# Patient Record
Sex: Male | Born: 1990 | Race: Black or African American | Hispanic: No | Marital: Single | Smoking: Never smoker
Health system: Southern US, Community
[De-identification: ages and names within clinical notes are randomized; demographics above are authoritative.]

## PROBLEM LIST (undated history)

## (undated) DIAGNOSIS — W3400XA Accidental discharge from unspecified firearms or gun, initial encounter: Secondary | ICD-10-CM

## (undated) DIAGNOSIS — R569 Unspecified convulsions: Secondary | ICD-10-CM

## (undated) DIAGNOSIS — J45909 Unspecified asthma, uncomplicated: Secondary | ICD-10-CM

## (undated) DIAGNOSIS — F909 Attention-deficit hyperactivity disorder, unspecified type: Secondary | ICD-10-CM

## (undated) HISTORY — PX: SHOULDER SURGERY: SHX246

---

## 2010-07-01 ENCOUNTER — Emergency Department (HOSPITAL_COMMUNITY): Admission: EM | Admit: 2010-07-01 | Discharge: 2010-07-01 | Payer: Self-pay | Admitting: Emergency Medicine

## 2010-08-16 ENCOUNTER — Ambulatory Visit (HOSPITAL_COMMUNITY): Admission: RE | Admit: 2010-08-16 | Discharge: 2010-08-16 | Payer: Self-pay | Admitting: Orthopedic Surgery

## 2010-12-25 ENCOUNTER — Encounter: Payer: Self-pay | Admitting: Orthopedic Surgery

## 2011-02-04 ENCOUNTER — Emergency Department (HOSPITAL_COMMUNITY)
Admission: EM | Admit: 2011-02-04 | Discharge: 2011-02-04 | Disposition: A | Discharge status: Home / Self Care | Payer: Medicaid Other | Attending: Emergency Medicine | Admitting: Emergency Medicine

## 2011-02-04 DIAGNOSIS — M25519 Pain in unspecified shoulder: Secondary | ICD-10-CM | POA: Insufficient documentation

## 2011-03-20 ENCOUNTER — Emergency Department (HOSPITAL_COMMUNITY)
Admission: EM | Admit: 2011-03-20 | Discharge: 2011-03-21 | Disposition: A | Discharge status: Home / Self Care | Payer: Medicaid Other | Source: Home / Self Care | Attending: Emergency Medicine | Admitting: Emergency Medicine

## 2011-03-20 DIAGNOSIS — Z9889 Other specified postprocedural states: Secondary | ICD-10-CM | POA: Insufficient documentation

## 2011-03-20 DIAGNOSIS — M25519 Pain in unspecified shoulder: Secondary | ICD-10-CM | POA: Insufficient documentation

## 2011-03-21 ENCOUNTER — Emergency Department (HOSPITAL_COMMUNITY)
Admission: EM | Admit: 2011-03-21 | Discharge: 2011-03-21 | Disposition: A | Discharge status: Home / Self Care | Payer: Medicaid Other | Attending: Emergency Medicine | Admitting: Emergency Medicine

## 2011-07-10 ENCOUNTER — Emergency Department (HOSPITAL_COMMUNITY)
Admission: EM | Admit: 2011-07-10 | Discharge: 2011-07-10 | Disposition: A | Discharge status: Home / Self Care | Payer: Medicaid Other | Attending: Emergency Medicine | Admitting: Emergency Medicine

## 2011-07-10 DIAGNOSIS — F3289 Other specified depressive episodes: Secondary | ICD-10-CM | POA: Insufficient documentation

## 2011-07-10 DIAGNOSIS — F329 Major depressive disorder, single episode, unspecified: Secondary | ICD-10-CM | POA: Insufficient documentation

## 2011-07-10 LAB — DIFFERENTIAL
Basophils Absolute: 0 10*3/uL (ref 0.0–0.1)
Eosinophils Absolute: 0.1 10*3/uL (ref 0.0–0.7)
Eosinophils Relative: 1 % (ref 0–5)
Lymphs Abs: 2.1 10*3/uL (ref 0.7–4.0)
Monocytes Absolute: 0.3 10*3/uL (ref 0.1–1.0)
Neutro Abs: 4.3 10*3/uL (ref 1.7–7.7)
Neutrophils Relative %: 63 % (ref 43–77)

## 2011-07-10 LAB — COMPREHENSIVE METABOLIC PANEL
ALT: 14 U/L (ref 0–53)
AST: 23 U/L (ref 0–37)
BUN: 13 mg/dL (ref 6–23)
Calcium: 9.9 mg/dL (ref 8.4–10.5)
Creatinine, Ser: 0.92 mg/dL (ref 0.50–1.35)
GFR calc Af Amer: >60 mL/min (ref >60)
Total Bilirubin: 0.4 mg/dL (ref 0.3–1.2)

## 2011-07-10 LAB — RAPID URINE DRUG SCREEN, HOSP PERFORMED
Barbiturates: NOT DETECTED
Benzodiazepines: NOT DETECTED
Opiates: NOT DETECTED

## 2011-07-10 LAB — URINALYSIS, ROUTINE W REFLEX MICROSCOPIC
Glucose, UA: NEGATIVE mg/dL
Hgb urine dipstick: NEGATIVE
Leukocytes, UA: NEGATIVE
Urobilinogen, UA: 1 mg/dL (ref 0.0–1.0)

## 2011-07-10 LAB — CBC
MCHC: 35.2 g/dL (ref 30.0–36.0)
MCV: 84.4 fL (ref 78.0–100.0)
Platelets: 166 10*3/uL (ref 150–400)
WBC: 6.7 10*3/uL (ref 4.0–10.5)

## 2011-07-10 LAB — ETHANOL: Alcohol, Ethyl (B): <11 mg/dL (ref 0–11)

## 2011-07-19 ENCOUNTER — Emergency Department (HOSPITAL_COMMUNITY)
Admission: EM | Admit: 2011-07-19 | Discharge: 2011-07-19 | Disposition: A | Discharge status: Home / Self Care | Payer: Medicaid Other | Attending: Emergency Medicine | Admitting: Emergency Medicine

## 2011-07-19 ENCOUNTER — Emergency Department (HOSPITAL_COMMUNITY): Payer: Medicaid Other

## 2011-07-19 DIAGNOSIS — S46909A Unspecified injury of unspecified muscle, fascia and tendon at shoulder and upper arm level, unspecified arm, initial encounter: Secondary | ICD-10-CM | POA: Insufficient documentation

## 2011-07-19 DIAGNOSIS — W010XXA Fall on same level from slipping, tripping and stumbling without subsequent striking against object, initial encounter: Secondary | ICD-10-CM | POA: Insufficient documentation

## 2011-07-19 DIAGNOSIS — Y9383 Activity, rough housing and horseplay: Secondary | ICD-10-CM | POA: Insufficient documentation

## 2011-07-19 DIAGNOSIS — S42293A Other displaced fracture of upper end of unspecified humerus, initial encounter for closed fracture: Secondary | ICD-10-CM | POA: Insufficient documentation

## 2011-07-19 DIAGNOSIS — Y9229 Other specified public building as the place of occurrence of the external cause: Secondary | ICD-10-CM | POA: Insufficient documentation

## 2011-07-19 DIAGNOSIS — S4980XA Other specified injuries of shoulder and upper arm, unspecified arm, initial encounter: Secondary | ICD-10-CM | POA: Insufficient documentation

## 2011-07-19 DIAGNOSIS — M25519 Pain in unspecified shoulder: Secondary | ICD-10-CM | POA: Insufficient documentation

## 2011-09-05 ENCOUNTER — Emergency Department (HOSPITAL_COMMUNITY): Payer: Medicaid Other

## 2011-09-05 ENCOUNTER — Emergency Department (HOSPITAL_COMMUNITY)
Admission: EM | Admit: 2011-09-05 | Discharge: 2011-09-05 | Disposition: A | Discharge status: Home / Self Care | Payer: Medicaid Other | Attending: Emergency Medicine | Admitting: Emergency Medicine

## 2011-09-05 DIAGNOSIS — T1490XA Injury, unspecified, initial encounter: Secondary | ICD-10-CM | POA: Insufficient documentation

## 2011-09-05 DIAGNOSIS — Y92410 Unspecified street and highway as the place of occurrence of the external cause: Secondary | ICD-10-CM | POA: Insufficient documentation

## 2011-09-05 DIAGNOSIS — G40909 Epilepsy, unspecified, not intractable, without status epilepticus: Secondary | ICD-10-CM | POA: Insufficient documentation

## 2011-09-05 DIAGNOSIS — Z79899 Other long term (current) drug therapy: Secondary | ICD-10-CM | POA: Insufficient documentation

## 2011-09-05 DIAGNOSIS — M542 Cervicalgia: Secondary | ICD-10-CM | POA: Insufficient documentation

## 2011-09-05 DIAGNOSIS — M25519 Pain in unspecified shoulder: Secondary | ICD-10-CM | POA: Insufficient documentation

## 2012-06-12 ENCOUNTER — Emergency Department (HOSPITAL_COMMUNITY)
Admission: EM | Admit: 2012-06-12 | Discharge: 2012-06-12 | Disposition: A | Discharge status: Home / Self Care | Payer: Medicaid Other | Attending: Emergency Medicine | Admitting: Emergency Medicine

## 2012-06-12 ENCOUNTER — Encounter (HOSPITAL_COMMUNITY): Payer: Self-pay | Admitting: *Deleted

## 2012-06-12 DIAGNOSIS — T148XXA Other injury of unspecified body region, initial encounter: Secondary | ICD-10-CM

## 2012-06-12 DIAGNOSIS — Y998 Other external cause status: Secondary | ICD-10-CM | POA: Insufficient documentation

## 2012-06-12 DIAGNOSIS — IMO0002 Reserved for concepts with insufficient information to code with codable children: Secondary | ICD-10-CM | POA: Insufficient documentation

## 2012-06-12 DIAGNOSIS — Y93I9 Activity, other involving external motion: Secondary | ICD-10-CM | POA: Insufficient documentation

## 2012-06-12 MED ORDER — TRAMADOL HCL 50 MG PO TABS
50.0000 mg | ORAL_TABLET | Freq: Once | ORAL | Status: AC
  Administered 2012-06-12: 50 mg via ORAL
  Filled 2012-06-12: qty 1

## 2012-06-12 MED ORDER — TRAMADOL HCL 50 MG PO TABS: 50.0000 mg | ORAL_TABLET | Freq: Four times a day (QID) | ORAL | Status: AC | PRN

## 2012-06-12 NOTE — ED Notes (Signed)
Pt d/c home in NAD. Pt voiced understanding of d/c instructions and follow up care. Pt ambulated to exit in NAD with quick, steady gait.
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
## 2012-06-13 NOTE — ED Provider Notes (Signed)
Medical screening examination/treatment/procedure(s) were performed by non-physician practitioner and as supervising physician I was immediately available for consultation/collaboration.  Geoffery Lyons, MD 06/13/12 [phone]

## 2012-10-17 ENCOUNTER — Encounter (HOSPITAL_COMMUNITY): Payer: Self-pay | Admitting: *Deleted

## 2012-10-17 ENCOUNTER — Emergency Department (HOSPITAL_COMMUNITY): Payer: Medicaid Other

## 2012-10-17 ENCOUNTER — Emergency Department (HOSPITAL_COMMUNITY)
Admission: EM | Admit: 2012-10-17 | Discharge: 2012-10-18 | Disposition: A | Discharge status: Home / Self Care | Payer: Medicaid Other | Attending: Emergency Medicine | Admitting: Emergency Medicine

## 2012-10-17 DIAGNOSIS — T148XXA Other injury of unspecified body region, initial encounter: Secondary | ICD-10-CM

## 2012-10-17 DIAGNOSIS — IMO0002 Reserved for concepts with insufficient information to code with codable children: Secondary | ICD-10-CM | POA: Insufficient documentation

## 2012-10-17 DIAGNOSIS — R569 Unspecified convulsions: Secondary | ICD-10-CM | POA: Insufficient documentation

## 2012-10-17 DIAGNOSIS — S6990XA Unspecified injury of unspecified wrist, hand and finger(s), initial encounter: Secondary | ICD-10-CM | POA: Insufficient documentation

## 2012-10-17 DIAGNOSIS — J45909 Unspecified asthma, uncomplicated: Secondary | ICD-10-CM | POA: Insufficient documentation

## 2012-10-17 HISTORY — DX: Unspecified asthma, uncomplicated: J45.909

## 2012-10-17 HISTORY — DX: Unspecified convulsions: R56.9

## 2012-10-17 MED ORDER — IBUPROFEN 400 MG PO TABS
800.0000 mg | ORAL_TABLET | Freq: Once | ORAL | Status: AC
  Administered 2012-10-17: 800 mg via ORAL
  Filled 2012-10-17: qty 2
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
## 2012-10-17 NOTE — ED Notes (Signed)
Pt in xray

## 2012-10-18 NOTE — Progress Notes (Signed)
Orthopedic Tech Progress Note Patient Details:  Chase Palmer August 25, 1991 [id number]  Ortho Devices Type of Ortho Device: Velcro wrist splint   Chase Palmer 10/18/2012, 12:30 AM

## 2012-10-18 NOTE — ED Notes (Signed)
Alert, NAD, calm, interactive, splint in place, CMS intact, cap refill <2sec, "feels better".

## 2012-10-18 NOTE — ED Provider Notes (Signed)
Medical screening examination/treatment/procedure(s) were performed by non-physician practitioner and as supervising physician I was immediately available for consultation/collaboration.  Olivia Mackie, MD 10/18/12 [phone]
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
## 2012-10-18 NOTE — ED Notes (Signed)
Pt resting, using smart phone, ice pack in place, xrays reviewed, given snack per request, registration finished at [facility].

## 2013-03-30 ENCOUNTER — Encounter (HOSPITAL_COMMUNITY): Payer: Self-pay | Admitting: *Deleted

## 2013-03-30 ENCOUNTER — Emergency Department (HOSPITAL_COMMUNITY)
Admission: EM | Admit: 2013-03-30 | Discharge: 2013-03-30 | Discharge status: Against Medical Advice | Payer: Medicaid Other | Attending: Emergency Medicine | Admitting: Emergency Medicine

## 2013-03-30 DIAGNOSIS — J3489 Other specified disorders of nose and nasal sinuses: Secondary | ICD-10-CM | POA: Insufficient documentation

## 2013-03-30 DIAGNOSIS — R1110 Vomiting, unspecified: Secondary | ICD-10-CM | POA: Insufficient documentation

## 2013-03-30 DIAGNOSIS — R52 Pain, unspecified: Secondary | ICD-10-CM | POA: Insufficient documentation

## 2013-03-30 DIAGNOSIS — J029 Acute pharyngitis, unspecified: Secondary | ICD-10-CM | POA: Insufficient documentation

## 2013-03-30 DIAGNOSIS — J45909 Unspecified asthma, uncomplicated: Secondary | ICD-10-CM | POA: Insufficient documentation

## 2013-03-30 NOTE — ED Notes (Signed)
Soret throat, congestion, body aches, and emesis x 1 last night

## 2015-11-05 ENCOUNTER — Emergency Department (HOSPITAL_COMMUNITY)
Admission: EM | Admit: 2015-11-05 | Discharge: 2015-11-05 | Disposition: A | Discharge status: Home / Self Care | Payer: Medicare Other | Attending: Emergency Medicine | Admitting: Emergency Medicine

## 2015-11-05 ENCOUNTER — Encounter (HOSPITAL_COMMUNITY): Payer: Self-pay

## 2015-11-05 ENCOUNTER — Emergency Department (HOSPITAL_COMMUNITY): Payer: Medicare Other

## 2015-11-05 DIAGNOSIS — Y9289 Other specified places as the place of occurrence of the external cause: Secondary | ICD-10-CM | POA: Diagnosis not present

## 2015-11-05 DIAGNOSIS — Z880 Allergy status to penicillin: Secondary | ICD-10-CM | POA: Insufficient documentation

## 2015-11-05 DIAGNOSIS — S6991XA Unspecified injury of right wrist, hand and finger(s), initial encounter: Secondary | ICD-10-CM | POA: Insufficient documentation

## 2015-11-05 DIAGNOSIS — Y998 Other external cause status: Secondary | ICD-10-CM | POA: Insufficient documentation

## 2015-11-05 DIAGNOSIS — W231XXA Caught, crushed, jammed, or pinched between stationary objects, initial encounter: Secondary | ICD-10-CM | POA: Diagnosis not present

## 2015-11-05 DIAGNOSIS — Y9371 Activity, boxing: Secondary | ICD-10-CM | POA: Insufficient documentation

## 2015-11-05 DIAGNOSIS — J45909 Unspecified asthma, uncomplicated: Secondary | ICD-10-CM | POA: Insufficient documentation

## 2015-11-05 MED ORDER — CIPROFLOXACIN HCL 500 MG PO TABS: 500.0000 mg | ORAL_TABLET | Freq: Two times a day (BID) | ORAL | Status: DC

## 2015-11-05 MED ORDER — CLINDAMYCIN HCL 150 MG PO CAPS: 450.0000 mg | ORAL_CAPSULE | Freq: Three times a day (TID) | ORAL | Status: DC
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
## 2015-11-05 NOTE — Discharge Instructions (Signed)
1. Medications: cipro, clindamycin, usual home medications 2. Treatment: rest, drink plenty of fluids 3. Follow Up: please followup with your primary doctor this week for discussion of your diagnoses and further evaluation after today's visit; if you do not have a primary care doctor use the resource guide provided to find one; please return to the ER for signs of worsening infection (increased redness, swelling, warmth, pain)    Emergency Department Resource Guide 1) Find a Doctor and Pay Out of Pocket Although you won't have to find out who is covered by your insurance plan, it is a good idea to ask around and get recommendations. You will then need to call the office and see if the doctor you have chosen will accept you as a new patient and what types of options they offer for patients who are self-pay. Some doctors offer discounts or will set up payment plans for their patients who do not have insurance, but you will need to ask so you aren't surprised when you get to your appointment.  2) Contact Your Local Health Department Not all health departments have doctors that can see patients for sick visits, but many do, so it is worth a call to see if yours does. If you don't know where your local health department is, you can check in your phone book. The CDC also has a tool to help you locate your state's health department, and many state websites also have listings of all of their local health departments.  3) Find a Walk-in Clinic If your illness is not likely to be very severe or complicated, you may want to try a walk in clinic. These are popping up all over the country in pharmacies, drugstores, and shopping centers. They're usually staffed by nurse practitioners or physician assistants that have been trained to treat common illnesses and complaints. They're usually fairly quick and inexpensive. However, if you have serious medical issues or chronic medical problems, these are probably not your  best option.  No Primary Care Doctor: - Call Health Connect at  [phone] - they can help you locate a primary care doctor that  accepts your insurance, provides certain services, etc. - Physician Referral Service- [phone]  Chronic Pain Problems: Organization         Address  Phone   Notes  [facility]  [phone] Patients need to be referred by their primary care doctor.   Medication Assistance: Organization         Address  Phone   Notes  [facility] [address] [place], [place] [address] [phone] --Must be a resident of [place] -- Must have NO insurance coverage whatsoever (no Medicaid/ Medicare, etc.) -- The pt. MUST have a primary care doctor that directs their care regularly and follows them in the community   MedAssist  [phone]   [employer]  [phone]    Agencies that provide inexpensive medical care: Organization         Address  Phone   Notes  [facility]  [phone]   [facility]    [phone]   [facility] [address] [place], [place] [address] [phone]   [facility] [address], [place] [phone]   Planned Parenthood    [phone]   [facility]    [phone]   [facility]  [address], [place] Phone:  [phone], Fax:  [phone] Hours of Operation:  9 am - 6 pm, M-F.  Also accepts Medicaid/Medicare and self-pay.  [facility] [place], [address], [place] Phone: [phone], Fax: [phone]. Hours of Operation:  8:30 am - 5:30 pm, M-F.  Also accepts Medicaid and self-pay.  [facility] [place] [address], [place] Phone: [phone]   [place], [place], [place] [phone], Ext. [phone] Mondays & Thursdays: 7-9 AM.  First 15  patients are seen on a first come, first serve basis.    [place] Providers:  Organization         Address  Phone   Notes  [facility] [address], [place] [phone] Also accepts self-pay patients.  [facility] [address], [place]  [phone]   [place], [address], [place] [phone]   [facility] [address] [place] [phone]   Lucianne Lei [address], [place]   [phone] Only accepts [place] Access [place] patients after they have their name applied to their card.   Self-Pay (no insurance) in [facility]:  Organization         Address  Phone   Notes  Sickle Cell Patients, [facility] [place] [phone]   [facility] [place] [phone]   [facility] [place]  [place], [place], [place] [phone]   [facility]/Dr. Osei-Bonsu  [address], [place] [address], [place] [phone] Phone number for both [place] and [place] locations is the same.  [facility] [address] [place] [phone]   [facility] [address], [place] or [address] [phone]   [facility] [address] [place] [phone], phone; [phone], fax Sees patients 1st and 3rd Saturday of every month.  Must not qualify for public or private insurance (i.e. Medicaid, Medicare, [place] Health Choice, Veterans' Benefits)  Household income should be no more than 200% of the poverty level The clinic cannot treat you if you are pregnant or think you are pregnant  Sexually transmitted diseases are not treated at the clinic.    Dental  Care: Organization         Address  Phone  Notes  [facility] Department of [facility] [place] [phone] Accepts children up to age 61 who are enrolled in [place] or [employer]; pregnant women with a Medicaid card; and children who have applied for Medicaid or [place] Health Choice, but were declined, whose parents can pay a reduced fee at time of service.  [facility] of [facility]  [address], [place] [phone] Accepts children up to age 47 who are enrolled in [place] or [place]; pregnant women with a Medicaid card; and children who have applied for Medicaid or [place] Health Choice, but were declined, whose parents can pay a reduced fee at time of service.  [facility] Adult Dental Access PROGRAM  [place], [place] [phone]  Patients are seen by appointment only. Walk-ins are not accepted. [place] will see patients 1 years of age and older. Monday - Tuesday (8am-5pm) Most Wednesdays (8:30-5pm) $30 per visit, cash only  [facility] Adult Dental Access PROGRAM  [address], [facility] [phone] Patients are seen by appointment only. Walk-ins are not accepted. [place] will see patients 28 years of age and older. One Wednesday Evening (Monthly: Volunteer Based).  $30 per visit, cash only  [place]  [phone] for adults; Children under age 81, call Graduate Pediatric Dentistry at [phone]. Children aged 82-14, please call [phone] to request a pediatric application.  Dental services are provided in all areas of dental care including fillings, crowns and bridges, complete and partial dentures, implants, gum treatment, root canals, and extractions. Preventive care is also provided. Treatment is provided to both adults and children. Patients are selected via a lottery and there is often a waiting list.   [facility] [address] [place]  [phone] [email or web address]   [facility] [address] [place], [place] [phone], Ext. [phone] Second and Fourth Thursday of each month, opens at 6:30 AM; Clinic ends at 9 AM.  Patients are seen on a first-come first-served basis, and a limited number are seen during each clinic.   [facility]  [address] Hillard Danker [place], [place] [phone]   Eligibility Requirements You must have lived in [place], [place], or [place] counties for at least the last three months.   You cannot be eligible for state or federal sponsored [employer], including [employer], [place], or [employer].   You generally cannot be eligible for healthcare insurance through your employer.    How to apply: Eligibility screenings are held every Tuesday and Wednesday afternoon from 1:00 pm until 4:00 pm. You do not need an appointment for the interview!  [facility] [address] [place], [place]   [place]  [place] Department  [place]  [phone]    Behavioral Health Resources in the Community: Intensive Outpatient Programs Organization         Address  Phone  Notes  [place]. [address], [place], [place] [phone]   [facility] Outpatient [address] [place], [place]   ADS: Alcohol & Drug Svcs [address], [place], [place]   [place] [address],  [place], [place] or [phone]   Substance Abuse Resources Organization         Address  Phone  Notes  Alcohol and Drug Services  [phone]   [place]  [phone]   The [place]   Chinita Pester  [phone]   Residential & Outpatient Substance Abuse Program  [phone]    Psychological Services Organization         Address  Phone  Notes  [facility] [place]  [place]  [phone]   [place] [address], [place] or [phone]    Mobile Crisis Teams Organization         Address  Phone  Notes  [facility], Mobile Crisis Care Unit  [phone]   [facility]ive Psychotherapeutic Services  [address] [employer], [place]   [facility] [place] [address] [place]  [place] [phone]    Self-Help/Support Groups Organization         Address  Phone             Notes  Mental Health Assoc. of [place] - variety of support groups  [place] Call for more information  Narcotics Anonymous (NA), Caring Services [address], [employer] [place]  2 meetings at this location   Special educational needs teacher         Address  Phone  Notes  ASAP Residential Treatment [place],    [place]  [phone]   [facility]  [address] [place] [phone], [place], [place]   [place], [place] [phone] Admissions: 8am-3pm M-F  Incentives Substance [place] [address],    [place], [place] [phone]   [facility] [address] [place], [place], [place]   [facility] [address]  [place], [place]   [facility] Programs - Intensive Outpatient [address] Kristeen [address], [place], [place]   [facility] [place].,  [place], [place] [phone] or [phone]   [facility] (RTS) [address], [place], [place] Accepts Medicaid  [facility] [place] [address]  [place] [phone] Substance Abuse/Addiction Treatment   [facility] Organization         Address  Phone  Notes  [facility]  [phone]   Domenic Schwab, PhD [address] Arlis [place], [place]   [phone] or [phone]   [place], [place] [phone]   [facility] [address], [place], [place] [phone] Insurance/Medicaid/sponsorship through [facility] and Families [address] [place]                                    [place], [place] [phone] [place] [address], [place] [phone]    Dr. Adele Schilder  [phone]   [facility] Dept. 1) [address],  2) [place] 3)  [place] [address], [place] [phone]  [phone]   [place] [phone] or [phone] (After Hours)

## 2016-09-04 ENCOUNTER — Encounter (HOSPITAL_COMMUNITY): Payer: Self-pay | Admitting: *Deleted

## 2016-09-04 ENCOUNTER — Emergency Department (HOSPITAL_COMMUNITY): Payer: Medicare Other

## 2016-09-04 ENCOUNTER — Emergency Department (HOSPITAL_COMMUNITY)
Admission: EM | Admit: 2016-09-04 | Discharge: 2016-09-04 | Disposition: A | Discharge status: Home / Self Care | Payer: Medicare Other | Attending: Emergency Medicine | Admitting: Emergency Medicine

## 2016-09-04 DIAGNOSIS — S199XXA Unspecified injury of neck, initial encounter: Secondary | ICD-10-CM | POA: Diagnosis not present

## 2016-09-04 DIAGNOSIS — Z79899 Other long term (current) drug therapy: Secondary | ICD-10-CM | POA: Diagnosis not present

## 2016-09-04 DIAGNOSIS — S0990XA Unspecified injury of head, initial encounter: Secondary | ICD-10-CM | POA: Insufficient documentation

## 2016-09-04 DIAGNOSIS — J45909 Unspecified asthma, uncomplicated: Secondary | ICD-10-CM | POA: Insufficient documentation

## 2016-09-04 DIAGNOSIS — Y939 Activity, unspecified: Secondary | ICD-10-CM | POA: Insufficient documentation

## 2016-09-04 DIAGNOSIS — Y999 Unspecified external cause status: Secondary | ICD-10-CM | POA: Insufficient documentation

## 2016-09-04 DIAGNOSIS — Y92410 Unspecified street and highway as the place of occurrence of the external cause: Secondary | ICD-10-CM | POA: Insufficient documentation

## 2016-09-04 MED ORDER — NAPROXEN 500 MG PO TABS: 500.0000 mg | ORAL_TABLET | Freq: Two times a day (BID) | ORAL | 0 refills | Status: DC

## 2016-09-04 MED ORDER — METHOCARBAMOL 500 MG PO TABS: 500.0000 mg | ORAL_TABLET | Freq: Two times a day (BID) | ORAL | 0 refills | Status: DC

## 2016-09-04 MED ORDER — KETOROLAC TROMETHAMINE 60 MG/2ML IM SOLN
30.0000 mg | Freq: Once | INTRAMUSCULAR | Status: AC
  Administered 2016-09-04: 30 mg via INTRAMUSCULAR
  Filled 2016-09-04: qty 2

## 2016-09-04 MED ORDER — OXYCODONE-ACETAMINOPHEN 5-325 MG PO TABS
ORAL_TABLET | ORAL | Status: AC
  Filled 2016-09-04: qty 1

## 2016-09-04 MED ORDER — OXYCODONE-ACETAMINOPHEN 5-325 MG PO TABS
1.0000 | ORAL_TABLET | Freq: Once | ORAL | Status: AC
  Administered 2016-09-04: 1 via ORAL

## 2016-09-04 MED ORDER — OXYCODONE-ACETAMINOPHEN 5-325 MG PO TABS: 1.0000 | ORAL_TABLET | ORAL | 0 refills | Status: DC | PRN
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
## 2016-09-04 NOTE — Discharge Instructions (Signed)
Your imaging studies and exam were reassuring. Expect to be quite sore over the next several days. Take medication as prescribed. Follow up with your primary care provider this week. Call Dr. Kathline MagicSwinteck's office to schedule an orthopedic evaluation as needed.

## 2016-11-27 ENCOUNTER — Emergency Department (HOSPITAL_COMMUNITY)
Admission: EM | Admit: 2016-11-27 | Discharge: 2016-11-27 | Disposition: A | Discharge status: Home / Self Care | Payer: Medicare Other | Attending: Emergency Medicine | Admitting: Emergency Medicine

## 2016-11-27 ENCOUNTER — Emergency Department (HOSPITAL_COMMUNITY): Payer: Medicare Other

## 2016-11-27 ENCOUNTER — Encounter (HOSPITAL_COMMUNITY): Payer: Self-pay

## 2016-11-27 DIAGNOSIS — J029 Acute pharyngitis, unspecified: Secondary | ICD-10-CM | POA: Insufficient documentation

## 2016-11-27 DIAGNOSIS — J069 Acute upper respiratory infection, unspecified: Secondary | ICD-10-CM | POA: Insufficient documentation

## 2016-11-27 DIAGNOSIS — B9789 Other viral agents as the cause of diseases classified elsewhere: Secondary | ICD-10-CM

## 2016-11-27 DIAGNOSIS — R05 Cough: Secondary | ICD-10-CM | POA: Diagnosis present

## 2016-11-27 DIAGNOSIS — J45909 Unspecified asthma, uncomplicated: Secondary | ICD-10-CM | POA: Insufficient documentation

## 2016-11-27 LAB — RAPID STREP SCREEN (MED CTR MEBANE ONLY): Streptococcus, Group A Screen (Direct): NEGATIVE

## 2016-11-27 MED ORDER — ALBUTEROL SULFATE (2.5 MG/3ML) 0.083% IN NEBU
5.0000 mg | INHALATION_SOLUTION | Freq: Once | RESPIRATORY_TRACT | Status: AC
  Administered 2016-11-27: 5 mg via RESPIRATORY_TRACT
  Filled 2016-11-27: qty 6

## 2016-11-27 NOTE — ED Provider Notes (Signed)
[facility]-EMERGENCY DEPT Provider Note   CSN: [id number] Arrival date & time: 11/27/16  [id number]  By signing my name below, I, Chase Palmer, attest that this documentation has been prepared under the direction and in the presence of Tilden FossaElizabeth Brytni Dray, MD. Electronically Signed: Doreatha MartinEva Palmer, ED Scribe. 11/27/16. 3:40 AM.     History   Chief Complaint Chief Complaint  Patient presents with  . Sore Throat    HPI Chase Palmer is a 25 y.o. male with h/o asthma who presents to the Emergency Department complaining of moderate, worsening sore throat onset this week. Pt also reports cough and congestion x 1 week. He states his sore throat is worsened with swallowing and breathing. Pt reports a subjective fever 3 days ago, but none since. He is a daily marijuana user, but states his frequency of use has decreased with his current symptoms. Pt denies difficulty tolerating secretions or swallowing.   The history is provided by the patient. No language interpreter was used.    Past Medical History:  Diagnosis Date  . Asthma   . Seizure (HCC)     There are no active problems to display for this patient.   History reviewed. No pertinent surgical history.     Home Medications    Prior to Admission medications   Not on File    Family History History reviewed. No pertinent family history.  Social History Social History  Substance Use Topics  . Smoking status: Never Smoker  . Smokeless tobacco: Never Used  . Alcohol use No     Allergies   Amoxicillin and Penicillins   Review of Systems Review of Systems  Constitutional: Negative for fever.  HENT: Positive for congestion and sore throat. Negative for trouble swallowing.   Respiratory: Positive for cough.   All other systems reviewed and are negative.    Physical Exam Updated Vital Signs BP 109/71 (BP Location: Left Arm)   Pulse 72   Temp 98.8 F (37.1 C) (Oral)   Resp 16   Ht 5\' 11"  (1.803 m)   Wt 173 lb (78.5 kg)   SpO2  96%   BMI 24.13 kg/m   Physical Exam  Constitutional: He is oriented to person, place, and time. He appears well-developed and well-nourished.  HENT:  Head: Normocephalic and atraumatic.  Right Ear: Tympanic membrane and ear canal normal.  Left Ear: Tympanic membrane and ear canal normal.  Mouth/Throat: Uvula is midline and mucous membranes are normal. Posterior oropharyngeal erythema present. No oropharyngeal exudate or posterior oropharyngeal edema.  Minimal erythema in PO.   Cardiovascular: Normal rate, regular rhythm and normal heart sounds.  Exam reveals no gallop and no friction rub.   No murmur heard. Pulmonary/Chest: Effort normal and breath sounds normal. No respiratory distress. He has no wheezes. He has no rales.  Abdominal: Soft. There is no tenderness. There is no rebound and no guarding.  Musculoskeletal: He exhibits no edema or tenderness.  Neurological: He is alert and oriented to person, place, and time.  Skin: Skin is warm and dry.  Psychiatric: He has a normal mood and affect. His behavior is normal.  Nursing note and vitals reviewed.    ED Treatments / Results   DIAGNOSTIC STUDIES: Oxygen Saturation is 96% on RA, adequate by my interpretation.    COORDINATION OF CARE: 3:38 AM Discussed treatment plan with pt at bedside which includes CXR, breathing tx, rapid strep and pt agreed to plan.    Labs (all labs ordered are listed, but only abnormal  results are displayed) Labs Reviewed  RAPID STREP SCREEN (NOT AT [facility])  CULTURE, GROUP A STREP [facility])    EKG  EKG Interpretation None       Radiology Dg Chest 2 View  Result Date: 11/27/2016 CLINICAL DATA:  Cough, fever, shortness of breath for 1 week. EXAM: CHEST  2 VIEW COMPARISON:  09/04/2016 FINDINGS: The cardiomediastinal contours are normal. The lungs are clear. Pulmonary vasculature is normal. No consolidation, pleural effusion, or pneumothorax. No acute osseous abnormalities are seen. Postsurgical  change in the right shoulder. IMPRESSION: No active cardiopulmonary disease.  No evidence of pneumonia. Electronically Signed   By: Rubye OaksMelanie  Ehinger M.D.   On: 11/27/2016 04:19    Procedures Procedures (including critical care time)  Medications Ordered in ED Medications  albuterol (PROVENTIL) (2.5 MG/3ML) 0.083% nebulizer solution 5 mg (5 mg Nebulization Given 11/27/16 0350)     Initial Impression / Assessment and Plan / ED Course  I have reviewed the triage vital signs and the nursing notes.  Pertinent labs & imaging results that were available during my care of the patient were reviewed by me and considered in my medical decision making (see chart for details).  Clinical Course   Patient here for evaluation of sore throat and cough, endorses heavy marijuana use. He has some mild posterior oropharyngeal erythema. There is no evidence of PTA or RPA. He is nontoxic in examination and rapid strep is negative. Chest x-ray is clear with no evidence of pneumonia. His lungs are clear on examination. Given his history of asthma a duoneb was given and to evaluate for relief of symptoms. He had no significant changes symptoms after DuoNeb. Discussed with patient who care for likely viral URI and pharyngitis. Discuss oral fluid hydration and ibuprofen or Tylenol for pain. Outpatient follow-up and return precautions discussed.  Final Clinical Impressions(s) / ED Diagnoses   Final diagnoses:  Sore throat  Viral URI with cough    New Prescriptions There are no discharge medications for this patient.   I personally performed the services described in this documentation, which was scribed in my presence. The recorded information has been reviewed and is accurate.    Tilden FossaElizabeth Tahlia Deamer, MD 11/27/16 [phone]

## 2016-11-27 NOTE — ED Triage Notes (Signed)
Pt complaining of sore throat and hoarseness. Pt states cold x 1 week. Pt states coughing up yellow sputum.

## 2016-11-29 LAB — CULTURE, GROUP A STREP (THRC)
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
## 2018-06-03 IMAGING — DX DG CHEST 2V
2 series · 2 of 2 positions shown · non-contrast
Comparison: 09/04/2016

CLINICAL DATA: Cough, fever, shortness of breath for 1 week.

EXAM:
CHEST  2 VIEW

[chest pa]
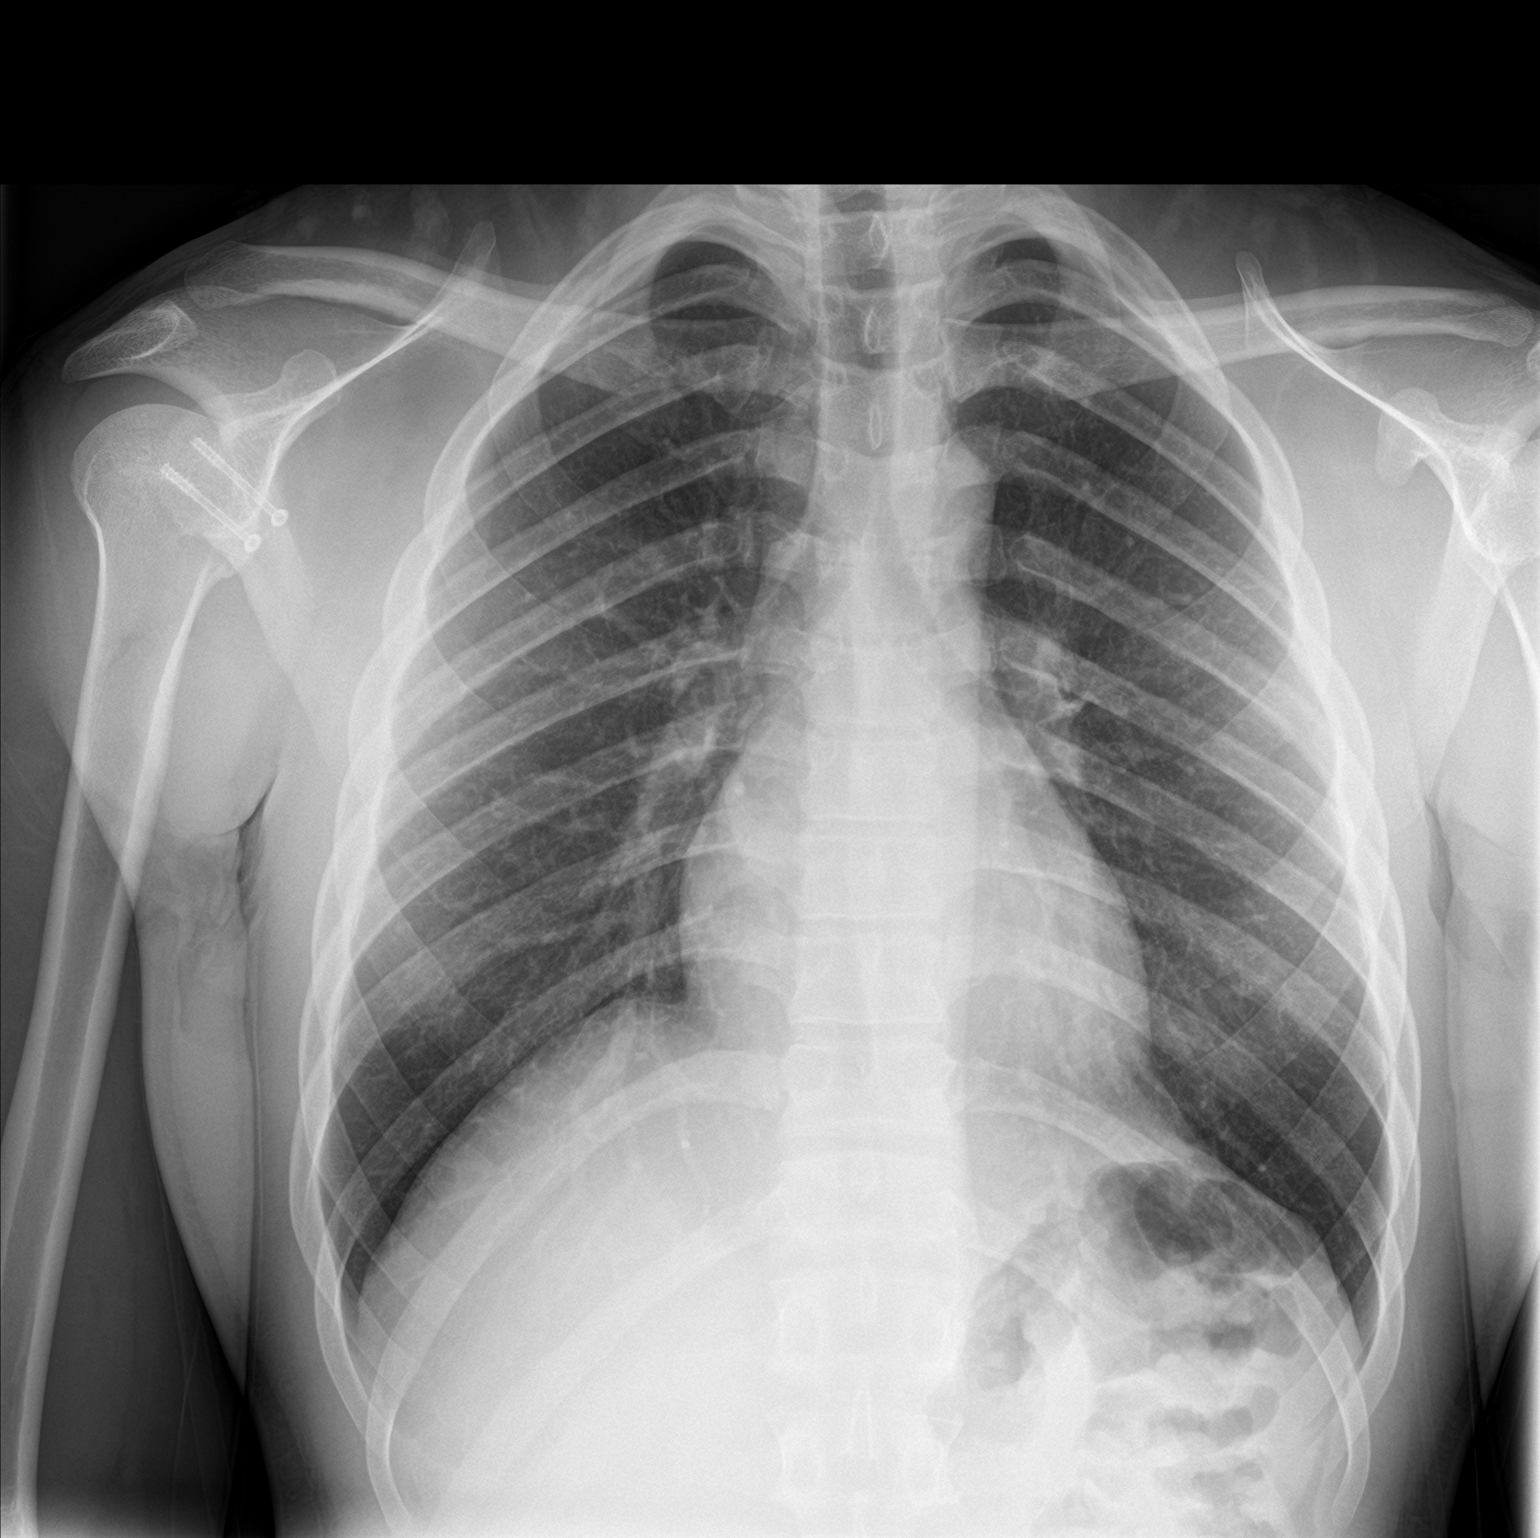

[chest lat]
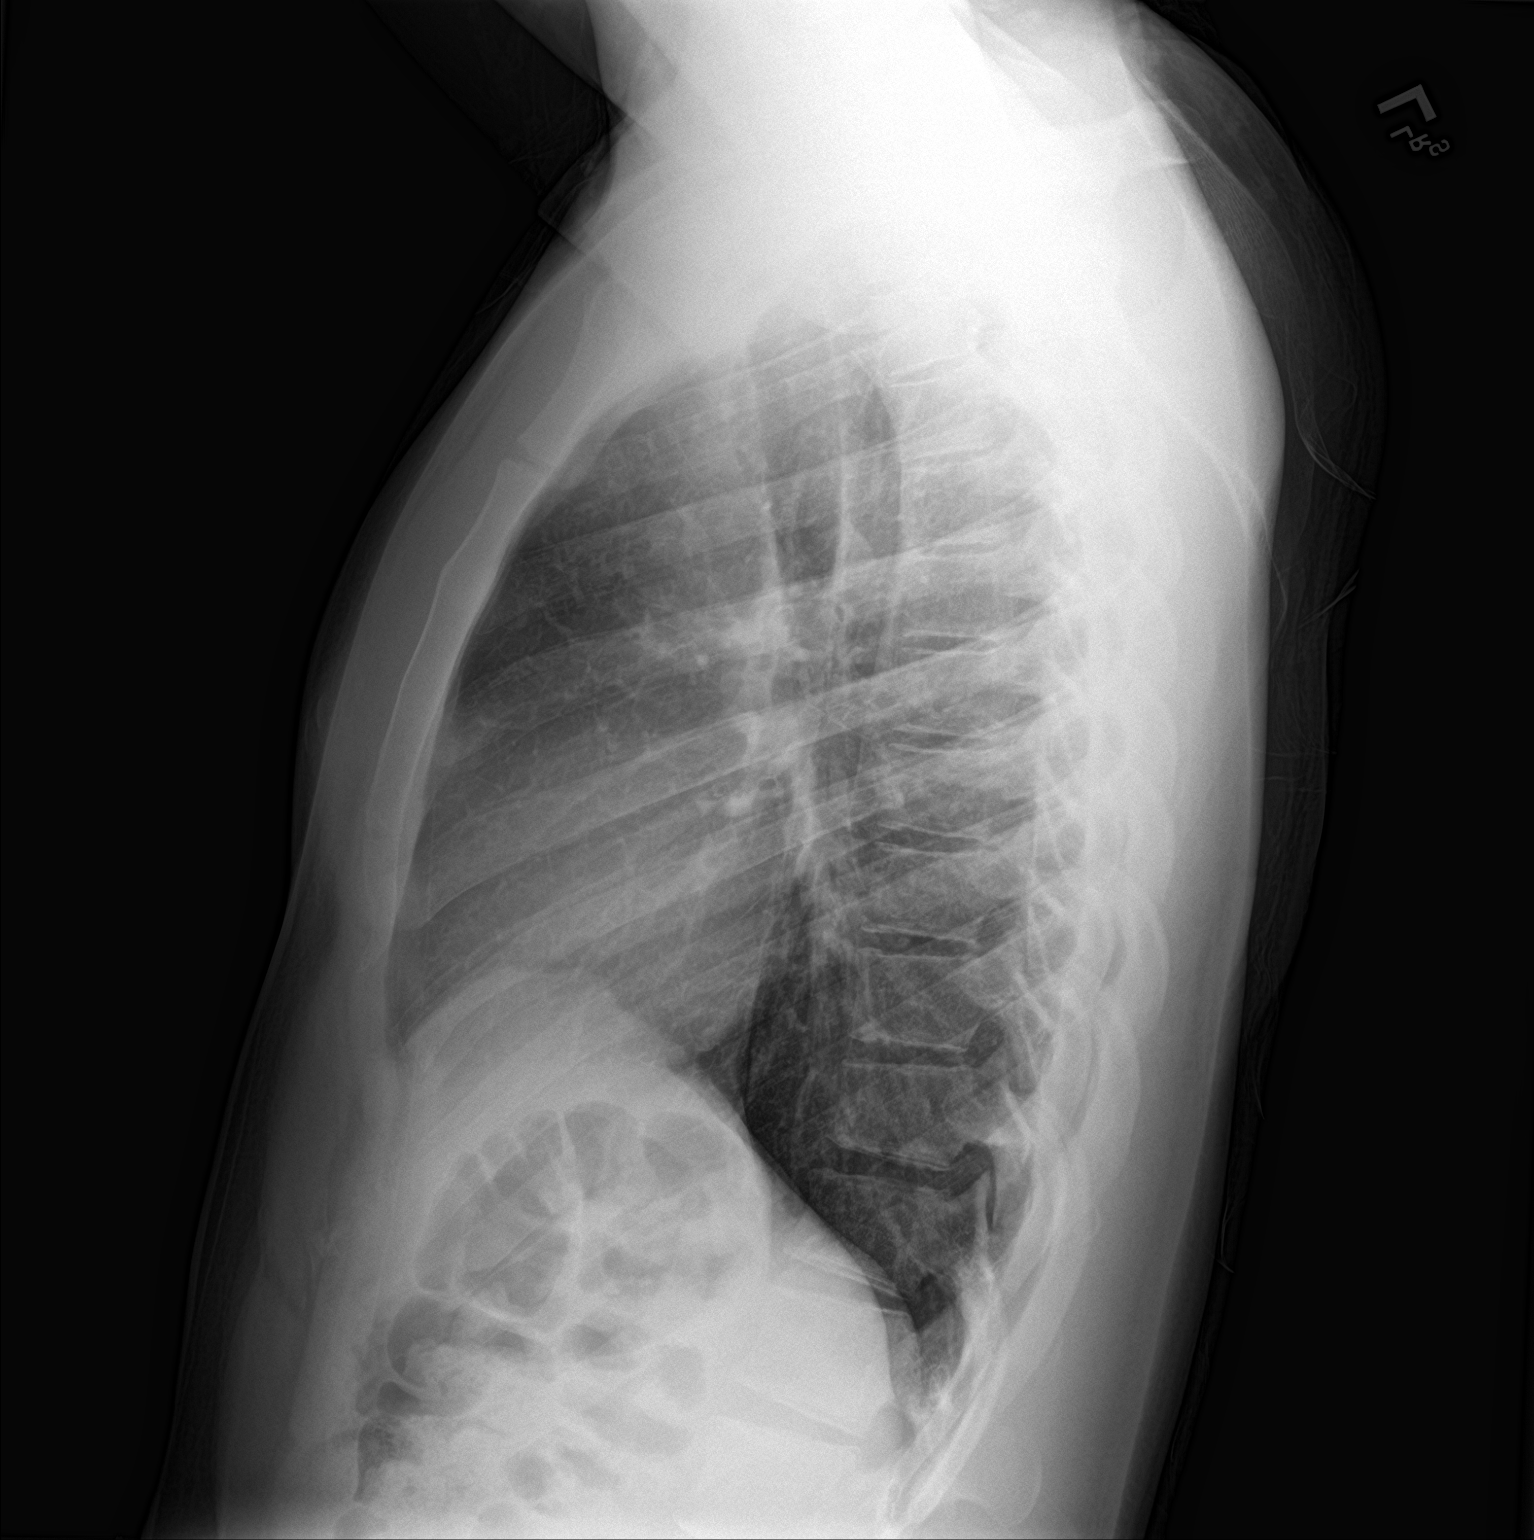

[2 of 2 positions shown; findings below may reference images not displayed]

FINDINGS: The cardiomediastinal contours are normal. The lungs are clear.
Pulmonary vasculature is normal. No consolidation, pleural effusion,
or pneumothorax. No acute osseous abnormalities are seen.
Postsurgical change in the right shoulder.
IMPRESSION: No active cardiopulmonary disease.  No evidence of pneumonia.

## 2023-05-17 ENCOUNTER — Emergency Department (HOSPITAL_COMMUNITY)
Admission: EM | Admit: 2023-05-17 | Discharge: 2023-05-17 | Disposition: A | Discharge status: Home / Self Care | Payer: Medicare Other | Attending: Emergency Medicine | Admitting: Emergency Medicine

## 2023-05-17 ENCOUNTER — Encounter (HOSPITAL_COMMUNITY): Payer: Self-pay

## 2023-05-17 ENCOUNTER — Emergency Department (HOSPITAL_COMMUNITY): Payer: Medicare Other

## 2023-05-17 ENCOUNTER — Other Ambulatory Visit: Payer: Self-pay

## 2023-05-17 DIAGNOSIS — M545 Low back pain, unspecified: Secondary | ICD-10-CM | POA: Insufficient documentation

## 2023-05-17 HISTORY — DX: Accidental discharge from unspecified firearms or gun, initial encounter: W34.00XA

## 2023-05-17 MED ORDER — KETOROLAC TROMETHAMINE 15 MG/ML IJ SOLN
15.0000 mg | Freq: Once | INTRAMUSCULAR | Status: AC
  Administered 2023-05-17: 15 mg via INTRAMUSCULAR
  Filled 2023-05-17: qty 1
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
## 2023-05-17 NOTE — Discharge Instructions (Signed)
Take 4 over the counter ibuprofen tablets 3 times a day or 2 over-the-counter naproxen tablets twice a day for pain. Also take tylenol 1000mg (2 extra strength) four times a day.    Please follow with your family doctor.  Typically your pain should improve significantly over the course of the week if you are still having ongoing issues then they can reimage areas.  Please return for worsening difficulty breathing confusion vomiting.
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
## 2023-06-01 ENCOUNTER — Emergency Department (HOSPITAL_COMMUNITY): Payer: Medicare Other

## 2023-06-01 ENCOUNTER — Encounter (HOSPITAL_COMMUNITY): Payer: Self-pay | Admitting: *Deleted

## 2023-06-01 ENCOUNTER — Other Ambulatory Visit: Payer: Self-pay

## 2023-06-01 ENCOUNTER — Emergency Department (HOSPITAL_COMMUNITY)
Admission: EM | Admit: 2023-06-01 | Discharge: 2023-06-01 | Disposition: A | Discharge status: Home / Self Care | Payer: Medicare Other | Attending: Emergency Medicine | Admitting: Emergency Medicine

## 2023-06-01 DIAGNOSIS — T310 Burns involving less than 10% of body surface: Secondary | ICD-10-CM | POA: Insufficient documentation

## 2023-06-01 DIAGNOSIS — R519 Headache, unspecified: Secondary | ICD-10-CM | POA: Diagnosis not present

## 2023-06-01 DIAGNOSIS — G40909 Epilepsy, unspecified, not intractable, without status epilepticus: Secondary | ICD-10-CM | POA: Diagnosis not present

## 2023-06-01 DIAGNOSIS — J439 Emphysema, unspecified: Secondary | ICD-10-CM | POA: Diagnosis not present

## 2023-06-01 DIAGNOSIS — T22031A Burn of unspecified degree of right upper arm, initial encounter: Secondary | ICD-10-CM | POA: Insufficient documentation

## 2023-06-01 DIAGNOSIS — S3991XA Unspecified injury of abdomen, initial encounter: Secondary | ICD-10-CM | POA: Diagnosis present

## 2023-06-01 DIAGNOSIS — Y92410 Unspecified street and highway as the place of occurrence of the external cause: Secondary | ICD-10-CM | POA: Diagnosis not present

## 2023-06-01 DIAGNOSIS — X19XXXA Contact with other heat and hot substances, initial encounter: Secondary | ICD-10-CM | POA: Diagnosis not present

## 2023-06-01 DIAGNOSIS — W2211XA Striking against or struck by driver side automobile airbag, initial encounter: Secondary | ICD-10-CM | POA: Diagnosis not present

## 2023-06-01 DIAGNOSIS — S62641A Nondisplaced fracture of proximal phalanx of left index finger, initial encounter for closed fracture: Secondary | ICD-10-CM | POA: Insufficient documentation

## 2023-06-01 DIAGNOSIS — T300 Burn of unspecified body region, unspecified degree: Secondary | ICD-10-CM

## 2023-06-01 DIAGNOSIS — Z79899 Other long term (current) drug therapy: Secondary | ICD-10-CM | POA: Insufficient documentation

## 2023-06-01 DIAGNOSIS — S299XXA Unspecified injury of thorax, initial encounter: Secondary | ICD-10-CM | POA: Diagnosis present

## 2023-06-01 DIAGNOSIS — R569 Unspecified convulsions: Secondary | ICD-10-CM | POA: Diagnosis not present

## 2023-06-01 DIAGNOSIS — M79601 Pain in right arm: Secondary | ICD-10-CM | POA: Diagnosis present

## 2023-06-01 LAB — COMPREHENSIVE METABOLIC PANEL
ALT: 17 U/L (ref 0–44)
AST: 26 U/L (ref 15–41)
Albumin: 4.3 g/dL (ref 3.5–5.0)
Alkaline Phosphatase: 58 U/L (ref 38–126)
Anion gap: 12 (ref 5–15)
BUN: 12 mg/dL (ref 6–20)
CO2: 21 mmol/L — ABNORMAL LOW (ref 22–32)
Calcium: 9.4 mg/dL (ref 8.9–10.3)
Chloride: 105 mmol/L (ref 98–111)
Creatinine, Ser: 1.38 mg/dL — ABNORMAL HIGH (ref 0.61–1.24)
GFR, Estimated: >60 mL/min (ref >60)
Glucose, Bld: 101 mg/dL — ABNORMAL HIGH (ref 70–99)
Potassium: 3.5 mmol/L (ref 3.5–5.1)
Sodium: 138 mmol/L (ref 135–145)
Total Bilirubin: 0.9 mg/dL (ref 0.3–1.2)
Total Protein: 6.8 g/dL (ref 6.5–8.1)

## 2023-06-01 LAB — I-STAT CHEM 8, ED
BUN: 12 mg/dL (ref 6–20)
Calcium, Ion: 1.15 mmol/L (ref 1.15–1.40)
Chloride: 106 mmol/L (ref 98–111)
Creatinine, Ser: 1.4 mg/dL — ABNORMAL HIGH (ref 0.61–1.24)
Glucose, Bld: 99 mg/dL (ref 70–99)
HCT: 51 % (ref 39.0–52.0)
Hemoglobin: 17.3 g/dL — ABNORMAL HIGH (ref 13.0–17.0)
Potassium: 3.4 mmol/L — ABNORMAL LOW (ref 3.5–5.1)
Sodium: 141 mmol/L (ref 135–145)
TCO2: 22 mmol/L (ref 22–32)

## 2023-06-01 LAB — CBC
HCT: 49.9 % (ref 39.0–52.0)
Hemoglobin: 16.9 g/dL (ref 13.0–17.0)
MCH: 29.3 pg (ref 26.0–34.0)
MCHC: 33.9 g/dL (ref 30.0–36.0)
MCV: 86.5 fL (ref 80.0–100.0)
Platelets: 186 10*3/uL (ref 150–400)
RBC: 5.77 MIL/uL (ref 4.22–5.81)
RDW: 13.5 % (ref 11.5–15.5)
WBC: 9.7 10*3/uL (ref 4.0–10.5)
nRBC: 0 % (ref 0.0–0.2)

## 2023-06-01 LAB — ETHANOL: Alcohol, Ethyl (B): <10 mg/dL (ref <10)

## 2023-06-01 LAB — VALPROIC ACID LEVEL: Valproic Acid Lvl: <10 ug/mL — ABNORMAL LOW (ref 50.0–100.0)

## 2023-06-01 MED ORDER — HYDROCODONE-ACETAMINOPHEN 5-325 MG PO TABS: 1.0000 | ORAL_TABLET | ORAL | 0 refills | Status: DC | PRN

## 2023-06-01 MED ORDER — SILVER SULFADIAZINE 1 % EX CREA
TOPICAL_CREAM | Freq: Once | CUTANEOUS | Status: AC
  Filled 2023-06-01: qty 85

## 2023-06-01 MED ORDER — IBUPROFEN 600 MG PO TABS: 600.0000 mg | ORAL_TABLET | Freq: Four times a day (QID) | ORAL | 0 refills | Status: DC | PRN

## 2023-06-01 MED ORDER — MORPHINE SULFATE (PF) 4 MG/ML IV SOLN
4.0000 mg | Freq: Once | INTRAVENOUS | Status: AC
  Administered 2023-06-01: 4 mg via INTRAVENOUS
  Filled 2023-06-01: qty 1

## 2023-06-01 MED ORDER — IOHEXOL 350 MG/ML SOLN
80.0000 mL | Freq: Once | INTRAVENOUS | Status: AC | PRN
  Administered 2023-06-01: 80 mL via INTRAVENOUS

## 2023-06-01 MED ORDER — ONDANSETRON HCL 4 MG/2ML IJ SOLN
4.0000 mg | Freq: Once | INTRAMUSCULAR | Status: AC
  Administered 2023-06-01: 4 mg via INTRAVENOUS
  Filled 2023-06-01: qty 2
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
## 2023-06-01 NOTE — ED Notes (Signed)
Patient transported to CT 
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
## 2023-06-01 NOTE — Discharge Instructions (Addendum)
No driving until you are seizure free for 6 months or until cleared by neurology.
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
## 2023-07-23 ENCOUNTER — Emergency Department (HOSPITAL_COMMUNITY)
Admission: EM | Admit: 2023-07-23 | Discharge: 2023-07-23 | Disposition: A | Discharge status: Home / Self Care | Payer: Medicare Other | Attending: Emergency Medicine | Admitting: Emergency Medicine

## 2023-07-23 ENCOUNTER — Encounter (HOSPITAL_COMMUNITY): Payer: Self-pay

## 2023-07-23 ENCOUNTER — Other Ambulatory Visit: Payer: Self-pay

## 2023-07-23 DIAGNOSIS — T6591XA Toxic effect of unspecified substance, accidental (unintentional), initial encounter: Secondary | ICD-10-CM

## 2023-07-23 DIAGNOSIS — T50901A Poisoning by unspecified drugs, medicaments and biological substances, accidental (unintentional), initial encounter: Secondary | ICD-10-CM | POA: Insufficient documentation

## 2023-07-23 DIAGNOSIS — X58XXXA Exposure to other specified factors, initial encounter: Secondary | ICD-10-CM | POA: Diagnosis not present

## 2023-07-23 LAB — CBC WITH DIFFERENTIAL/PLATELET
Abs Immature Granulocytes: 0.03 10*3/uL (ref 0.00–0.07)
Basophils Absolute: 0 10*3/uL (ref 0.0–0.1)
Basophils Relative: 0 %
Eosinophils Absolute: 0 10*3/uL (ref 0.0–0.5)
Eosinophils Relative: 0 %
HCT: 42.7 % (ref 39.0–52.0)
Hemoglobin: 14.3 g/dL (ref 13.0–17.0)
Immature Granulocytes: 0 %
Lymphocytes Relative: 31 %
Lymphs Abs: 3.4 10*3/uL (ref 0.7–4.0)
MCH: 29.6 pg (ref 26.0–34.0)
MCHC: 33.5 g/dL (ref 30.0–36.0)
MCV: 88.4 fL (ref 80.0–100.0)
Monocytes Absolute: 0.5 10*3/uL (ref 0.1–1.0)
Monocytes Relative: 4 %
Neutro Abs: 7.3 10*3/uL (ref 1.7–7.7)
Neutrophils Relative %: 65 %
Platelets: 183 10*3/uL (ref 150–400)
RBC: 4.83 MIL/uL (ref 4.22–5.81)
RDW: 13.9 % (ref 11.5–15.5)
WBC: 11.2 10*3/uL — ABNORMAL HIGH (ref 4.0–10.5)
nRBC: 0 % (ref 0.0–0.2)

## 2023-07-23 LAB — COMPREHENSIVE METABOLIC PANEL
ALT: 18 U/L (ref 0–44)
AST: 19 U/L (ref 15–41)
Albumin: 4.2 g/dL (ref 3.5–5.0)
Alkaline Phosphatase: 47 U/L (ref 38–126)
Anion gap: 7 (ref 5–15)
BUN: 7 mg/dL (ref 6–20)
CO2: 25 mmol/L (ref 22–32)
Calcium: 9.2 mg/dL (ref 8.9–10.3)
Chloride: 107 mmol/L (ref 98–111)
Creatinine, Ser: 1.16 mg/dL (ref 0.61–1.24)
GFR, Estimated: >60 mL/min (ref >60)
Glucose, Bld: 95 mg/dL (ref 70–99)
Potassium: 3.4 mmol/L — ABNORMAL LOW (ref 3.5–5.1)
Sodium: 139 mmol/L (ref 135–145)
Total Bilirubin: 0.8 mg/dL (ref 0.3–1.2)
Total Protein: 6.8 g/dL (ref 6.5–8.1)

## 2023-07-23 LAB — ACETAMINOPHEN LEVEL: Acetaminophen (Tylenol), Serum: <10 ug/mL — ABNORMAL LOW (ref 10–30)

## 2023-07-23 LAB — RAPID URINE DRUG SCREEN, HOSP PERFORMED
Amphetamines: POSITIVE — AB
Barbiturates: NOT DETECTED
Benzodiazepines: NOT DETECTED
Cocaine: NOT DETECTED
Opiates: NOT DETECTED
Tetrahydrocannabinol: POSITIVE — AB

## 2023-07-23 LAB — SALICYLATE LEVEL: Salicylate Lvl: <7 mg/dL — ABNORMAL LOW (ref 7.0–30.0)

## 2023-07-23 LAB — VALPROIC ACID LEVEL: Valproic Acid Lvl: <10 ug/mL — ABNORMAL LOW (ref 50.0–100.0)

## 2023-07-23 LAB — AMMONIA: Ammonia: 12 umol/L (ref 9–35)

## 2023-07-23 LAB — ETHANOL: Alcohol, Ethyl (B): <10 mg/dL (ref <10)

## 2023-07-23 MED ORDER — LORAZEPAM 1 MG PO TABS
1.0000 mg | ORAL_TABLET | Freq: Once | ORAL | Status: DC
  Filled 2023-07-23: qty 1
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
## 2023-07-23 NOTE — Discharge Instructions (Signed)
Keep yourself hydrated.  Follow-up with your primary doctor.  Return to the ED with chest pain, shortness of breath, dizziness, lightheadedness or any other concerns
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
## 2023-10-03 ENCOUNTER — Other Ambulatory Visit: Payer: Self-pay

## 2023-10-03 ENCOUNTER — Encounter (HOSPITAL_BASED_OUTPATIENT_CLINIC_OR_DEPARTMENT_OTHER): Payer: Self-pay | Admitting: Orthopedic Surgery
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
## 2023-10-10 ENCOUNTER — Ambulatory Visit (HOSPITAL_BASED_OUTPATIENT_CLINIC_OR_DEPARTMENT_OTHER): Payer: Medicare Other | Admitting: Anesthesiology

## 2023-10-10 ENCOUNTER — Ambulatory Visit (HOSPITAL_BASED_OUTPATIENT_CLINIC_OR_DEPARTMENT_OTHER)
Admission: RE | Admit: 2023-10-10 | Discharge: 2023-10-10 | Disposition: A | Discharge status: Home / Self Care | Payer: Medicare Other | Attending: Orthopedic Surgery | Admitting: Orthopedic Surgery

## 2023-10-10 ENCOUNTER — Encounter (HOSPITAL_BASED_OUTPATIENT_CLINIC_OR_DEPARTMENT_OTHER): Payer: Self-pay | Admitting: Orthopedic Surgery

## 2023-10-10 ENCOUNTER — Encounter (HOSPITAL_BASED_OUTPATIENT_CLINIC_OR_DEPARTMENT_OTHER)
Admission: RE | Disposition: A | Discharge status: Home / Self Care | Payer: Self-pay | Source: Home / Self Care | Attending: Orthopedic Surgery

## 2023-10-10 ENCOUNTER — Other Ambulatory Visit: Payer: Self-pay

## 2023-10-10 DIAGNOSIS — G5602 Carpal tunnel syndrome, left upper limb: Secondary | ICD-10-CM

## 2023-10-10 DIAGNOSIS — R569 Unspecified convulsions: Secondary | ICD-10-CM | POA: Insufficient documentation

## 2023-10-10 HISTORY — PX: CARPAL TUNNEL RELEASE: SHX101

## 2023-10-10 HISTORY — DX: Attention-deficit hyperactivity disorder, unspecified type: F90.9

## 2023-10-10 SURGERY — CARPAL TUNNEL RELEASE
Anesthesia: Monitor Anesthesia Care | Site: Hand | Laterality: Left

## 2023-10-10 MED ORDER — ONDANSETRON HCL 4 MG/2ML IJ SOLN: 4.0000 mg | Freq: Once | INTRAMUSCULAR | Status: DC | PRN

## 2023-10-10 MED ORDER — LACTATED RINGERS IV SOLN: INTRAVENOUS | Status: DC

## 2023-10-10 MED ORDER — ONDANSETRON HCL 4 MG/2ML IJ SOLN
INTRAMUSCULAR | Status: DC | PRN
  Administered 2023-10-10: 4 mg via INTRAVENOUS

## 2023-10-10 MED ORDER — MIDAZOLAM HCL 2 MG/2ML IJ SOLN
INTRAMUSCULAR | Status: DC | PRN
  Administered 2023-10-10: 2 mg via INTRAVENOUS

## 2023-10-10 MED ORDER — OXYCODONE HCL 5 MG/5ML PO SOLN: 5.0000 mg | Freq: Once | ORAL | Status: DC | PRN

## 2023-10-10 MED ORDER — PROPOFOL 500 MG/50ML IV EMUL
INTRAVENOUS | Status: AC
  Filled 2023-10-10: qty 50

## 2023-10-10 MED ORDER — BUPIVACAINE HCL (PF) 0.25 % IJ SOLN
INTRAMUSCULAR | Status: DC | PRN
  Administered 2023-10-10: 10 mL

## 2023-10-10 MED ORDER — OXYCODONE HCL 5 MG PO TABS: 5.0000 mg | ORAL_TABLET | Freq: Four times a day (QID) | ORAL | 0 refills | Status: AC | PRN

## 2023-10-10 MED ORDER — DEXAMETHASONE SODIUM PHOSPHATE 10 MG/ML IJ SOLN
INTRAMUSCULAR | Status: DC | PRN
  Administered 2023-10-10: 4 mg via INTRAVENOUS

## 2023-10-10 MED ORDER — MIDAZOLAM HCL 2 MG/2ML IJ SOLN
INTRAMUSCULAR | Status: AC
  Filled 2023-10-10: qty 2

## 2023-10-10 MED ORDER — OXYCODONE HCL 5 MG PO TABS: 5.0000 mg | ORAL_TABLET | Freq: Once | ORAL | Status: DC | PRN

## 2023-10-10 MED ORDER — PROPOFOL 500 MG/50ML IV EMUL
INTRAVENOUS | Status: DC | PRN
  Administered 2023-10-10: 150 ug/kg/min via INTRAVENOUS

## 2023-10-10 MED ORDER — FENTANYL CITRATE (PF) 100 MCG/2ML IJ SOLN
INTRAMUSCULAR | Status: AC
  Filled 2023-10-10: qty 2

## 2023-10-10 MED ORDER — FENTANYL CITRATE (PF) 250 MCG/5ML IJ SOLN
INTRAMUSCULAR | Status: DC | PRN
  Administered 2023-10-10: 100 ug via INTRAVENOUS

## 2023-10-10 MED ORDER — 0.9 % SODIUM CHLORIDE (POUR BTL) OPTIME
TOPICAL | Status: DC | PRN
  Administered 2023-10-10: 100 mL

## 2023-10-10 MED ORDER — ACETAMINOPHEN 10 MG/ML IV SOLN: 1000.0000 mg | Freq: Once | INTRAVENOUS | Status: DC | PRN

## 2023-10-10 MED ORDER — FENTANYL CITRATE (PF) 100 MCG/2ML IJ SOLN: 25.0000 ug | INTRAMUSCULAR | Status: DC | PRN

## 2023-10-10 MED ORDER — ONDANSETRON HCL 4 MG/2ML IJ SOLN
INTRAMUSCULAR | Status: AC
  Filled 2023-10-10: qty 2

## 2023-10-10 SURGICAL SUPPLY — 30 items
APL PRP STRL LF DISP 70% ISPRP (MISCELLANEOUS) ×1
BLADE SURG 15 STRL LF DISP TIS (BLADE) ×1 IMPLANT
BLADE SURG 15 STRL SS (BLADE) ×1
BNDG CMPR 5X3 KNIT ELC UNQ LF (GAUZE/BANDAGES/DRESSINGS) ×1
BNDG CMPR 9X4 STRL LF SNTH (GAUZE/BANDAGES/DRESSINGS) ×1
BNDG ELASTIC 3INX 5YD STR LF (GAUZE/BANDAGES/DRESSINGS) ×1 IMPLANT
BNDG ESMARK 4X9 LF (GAUZE/BANDAGES/DRESSINGS) ×1 IMPLANT
BNDG GAUZE DERMACEA FLUFF 4 (GAUZE/BANDAGES/DRESSINGS) ×1 IMPLANT
BNDG GZE DERMACEA 4 6PLY (GAUZE/BANDAGES/DRESSINGS) ×1
CHLORAPREP W/TINT 26 (MISCELLANEOUS) ×1 IMPLANT
CORD BIPOLAR FORCEPS 12FT (ELECTRODE) ×1 IMPLANT
COVER BACK TABLE 60X90IN (DRAPES) ×1 IMPLANT
CUFF TOURN SGL QUICK 18X4 (TOURNIQUET CUFF) ×1 IMPLANT
DRAPE EXTREMITY T 121X128X90 (DISPOSABLE) ×1 IMPLANT
DRAPE SURG 17X23 STRL (DRAPES) ×1 IMPLANT
GAUZE XEROFORM 1X8 LF (GAUZE/BANDAGES/DRESSINGS) ×1 IMPLANT
GLOVE BIO SURGEON STRL SZ7 (GLOVE) ×1 IMPLANT
GLOVE BIOGEL PI IND STRL 7.0 (GLOVE) ×1 IMPLANT
GOWN STRL REUS W/ TWL LRG LVL3 (GOWN DISPOSABLE) ×2 IMPLANT
GOWN STRL REUS W/TWL LRG LVL3 (GOWN DISPOSABLE) ×2
NDL HYPO 25X1 1.5 SAFETY (NEEDLE) ×1 IMPLANT
NEEDLE HYPO 25X1 1.5 SAFETY (NEEDLE) ×1
NS IRRIG 1000ML POUR BTL (IV SOLUTION) ×1 IMPLANT
PACK BASIN DAY SURGERY FS (CUSTOM PROCEDURE TRAY) ×1 IMPLANT
SHEET MEDIUM DRAPE 40X70 STRL (DRAPES) ×1 IMPLANT
SUT ETHILON 4 0 PS 2 18 (SUTURE) ×1 IMPLANT
SYR BULB EAR ULCER 3OZ GRN STR (SYRINGE) ×1 IMPLANT
SYR CONTROL 10ML LL (SYRINGE) ×1 IMPLANT
TOWEL GREEN STERILE FF (TOWEL DISPOSABLE) ×2 IMPLANT
UNDERPAD 30X36 HEAVY ABSORB (UNDERPADS AND DIAPERS) ×1 IMPLANT

## 2023-10-10 NOTE — Discharge Instructions (Addendum)
Audria Nine, M.D. Hand Surgery  POST-OPERATIVE DISCHARGE INSTRUCTIONS   PRESCRIPTIONS: - You may have been given a prescription to be taken as directed for post-operative pain control.  You may also take over the counter ibuprofen/aleve and tylenol for pain. Take this as directed on the packaging. Do not exceed 3000 mg tylenol/acetaminophen in 24 hours.  Ibuprofen 600-800 mg (3-4) tablets by mouth every 6 hours as needed for pain.   OR  Aleve 2 tablets by mouth every 12 hours (twice daily) as needed for pain.   AND/OR  Tylenol 1000 mg (2 tablets) every 8 hours as needed for pain.  - Please use your pain medication carefully, as refills are limited and you may not be provided with one.  As stated above, please use over the counter pain medicine - it will also be helpful with decreasing your swelling.    ANESTHESIA: -After your surgery, post-surgical discomfort or pain is likely. This discomfort can last several days to a few weeks. At certain times of the day your discomfort may be more intense.   Did you receive a nerve block?   - A nerve block can provide pain relief for one hour to two days after your surgery. As long as the nerve block is working, you will experience little or no sensation in the area the surgeon operated on.  - As the nerve block wears off, you will begin to experience pain or discomfort. It is very important that you begin taking your prescribed pain medication before the nerve block fully wears off. Treating your pain at the first sign of the block wearing off will ensure your pain is better controlled and more tolerable when full-sensation returns. Do not wait until the pain is intolerable, as the medicine will be less effective. It is better to treat pain in advance than to try and catch up.   General Anesthesia:  If you did not receive a nerve block during your surgery, you will need to start taking your pain medication shortly after your surgery and  should continue to do so as prescribed by your surgeon.     ICE AND ELEVATION: - You may use ice for the first 48-72 hours, but it is not critical.   - Motion of your fingers is very important to decrease the swelling.  - Elevation, as much as possible for the next 48 hours, is critical for decreasing swelling as well as for pain relief. Elevation means when you are seated or lying down, you hand should be at or above your heart. When walking, the hand needs to be at or above the level of your elbow.  - If the bandage gets too tight, it may need to be loosened. Please contact our office and we will instruct you in how to do this.    SURGICAL BANDAGES:  - Keep your dressing and/or splint clean and dry at all times.  You can remove your dressing 7 days from now and change with a dry dressing or Band-Aids as needed thereafter. - You may place a plastic bag over your bandage to shower, but be careful, do not get your bandages wet.  - After the bandages have been removed, it is OK to get the stitches wet in a shower or with hand washing. Do Not soak or submerge the wound yet. Please do not use lotions or creams on the stitches.      HAND THERAPY:  - You may not need any. If you  do, we will begin this at your follow up visit in the clinic.    ACTIVITY AND WORK: - You are encouraged to move any fingers which are not in the bandage.  - Light use of the fingers is allowed to assist the other hand with daily hygiene and eating, but strong gripping or lifting is often uncomfortable and should be avoided.  - You might miss a variable period of time from work and hopefully this issue has been discussed prior to surgery. You may not do any heavy work with your affected hand for about 2 weeks.    [facility] [address] Floor, [address] [place], [place] [address] [phone]    Post Anesthesia Home Care Instructions  Activity: Get plenty of rest for the remainder of the day. A  responsible individual must stay with you for 24 hours following the procedure.  For the next 24 hours, DO NOT: -Drive a car -Paediatric nurse -Drink alcoholic beverages -Take any medication unless instructed by your physician -Make any legal decisions or sign important papers.  Meals: Start with liquid foods such as gelatin or soup. Progress to regular foods as tolerated. Avoid greasy, spicy, heavy foods. If nausea and/or vomiting occur, drink only clear liquids until the nausea and/or vomiting subsides. Call your physician if vomiting continues.  Special Instructions/Symptoms: Your throat may feel dry or sore from the anesthesia or the breathing tube placed in your throat during surgery. If this causes discomfort, gargle with warm salt water. The discomfort should disappear within 24 hours.  If you had a scopolamine patch placed behind your ear for the management of post- operative nausea and/or vomiting:  1. The medication in the patch is effective for 72 hours, after which it should be removed.  Wrap patch in a tissue and discard in the trash. Wash hands thoroughly with soap and water. 2. You may remove the patch earlier than 72 hours if you experience unpleasant side effects which may include dry mouth, dizziness or visual disturbances. 3. Avoid touching the patch. Wash your hands with soap and water after contact with the patch.

## 2023-10-10 NOTE — Transfer of Care (Signed)
Immediate Anesthesia Transfer of Care Note  Patient: Chase Palmer  Procedure(s) Performed: CARPAL TUNNEL RELEASE (Left: Hand)  Patient Location: PACU  Anesthesia Type:MAC  Level of Consciousness: drowsy, patient cooperative, and responds to stimulation  Airway & Oxygen Therapy: Patient Spontanous Breathing and Patient connected to face mask oxygen  Post-op Assessment: Report given to RN and Post -op Vital signs reviewed and stable  Post vital signs: Reviewed and stable  Last Vitals:  Vitals Value Taken Time  BP    Temp    Pulse 65 10/10/23 1003  Resp 7 10/10/23 1003  SpO2 95 % 10/10/23 1003    Last Pain:  Vitals:   10/10/23 0736  TempSrc: Oral  PainSc: 0-No pain      Patients Stated Pain Goal: 3 (10/10/23 0736)  Complications: No notable events documented.

## 2023-10-10 NOTE — Op Note (Signed)
   Date of Surgery: 10/10/2023  INDICATIONS: Patient is a 32 y.o.-year-old male with left carpal tunnel syndrome with electromyographic evidence of thenar denervation.  Risks, benefits, and alternatives to surgery were again discussed with the patient in the preoperative area. The patient wishes to proceed with surgery.  Informed consent was signed after our discussion.   PREOPERATIVE DIAGNOSIS:  Left carpal tunnel syndrome  POSTOPERATIVE DIAGNOSIS: Same.  PROCEDURE: Left carpal tunnel release   SURGEON: Waylan Rocher, M.D.  ASSIST:   ANESTHESIA:  Local, MAC  IV FLUIDS AND URINE: See anesthesia.  ESTIMATED BLOOD LOSS: <5 mL.  IMPLANTS: * No implants in log *   DRAINS: None  COMPLICATIONS: None  DESCRIPTION OF PROCEDURE: The patient was met in the preoperative holding area where the surgical site was marked and the informed consent form was signed.  The patient was then brought back to the operating room and remained on the stretcher.  A hand table was placed adjacent to the operative extremity and locked into place.  A tourniquet was placed on the left forearm.  A formal timeout was performed to confirm that this was the correct patient, surgical side, surgical site, and surgical procedure.  All were present and in agreement. Following formal timeout, a local block was performed using 10 mL of 0.25% plain marcaine.  The left upper extremity was then prepped and draped in the usual and sterile fashion.   Following a second timeout, the limb was exsanguinated and the tourniquet inflated to 250 mmHg.  A longitudinal incision was made in line with the radial border of the ring finger from distal to the wrist flexion crease to the intersection of Kaplan's cardinal line.  The skin and subcutaneous tissue was sharply divided.  The longitudinally running palmar fascia was incised.  The thenar musculature was bluntly swept off of the transverse carpal ligament.  The ligament was divided from  proximal to distal until the fat surrounding the palmar arch was encountered. Retractors were then placed in the proximal aspect of the wound to visualize the distal antebrachial fascia.  The fascia was sharply divided under direct visualization.   The wound was then thoroughly irrigated with sterile saline.  The tourniquet was deflated.  Hemostasis was achieved with direct pressure and bipolar electrocautery.  The wound was then closed with 4-0 nylon sutures in a horizontal mattress fashion. The wound was then dressed with xeroform, folded kerlix, and an ace wrap.  The patient was reversed from sedation and the drapes taken down.   All counts were correct x 2 at the end of the procedure.  The patient was then taken to the PACU in stable condition.     POSTOPERATIVE PLAN: He will be discharged to home with appropriate pain medication and discharge instructions.  I'll see him back in 10-14 days for his first postop visit.   Waylan Rocher, MD 9:59 AM

## 2023-10-10 NOTE — Interval H&P Note (Signed)
History and Physical Interval Note:  10/10/2023 8:25 AM  Chase Palmer  has presented today for surgery, with the diagnosis of Carpal Tunnel Syndrome.  The various methods of treatment have been discussed with the patient and family. After consideration of risks, benefits and other options for treatment, the patient has consented to  Procedure(s) with comments: CARPAL TUNNEL RELEASE (Left) - Mac and Local  30 min as a surgical intervention.  The patient's history has been reviewed, patient examined, no change in status, stable for surgery.  I have reviewed the patient's chart and labs.  Questions were answered to the patient's satisfaction.     Arlene Genova
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
## 2023-10-10 NOTE — Anesthesia Preprocedure Evaluation (Addendum)
Anesthesia Evaluation  Patient identified by MRN, date of birth, ID band Patient awake    Reviewed: Allergy & Precautions, NPO status , Patient's Chart, lab work & pertinent test results, reviewed documented beta blocker date and time   History of Anesthesia Complications Negative for: history of anesthetic complications  Airway Mallampati: II  TM Distance: >3 FB     Dental no notable dental hx.    Pulmonary neg shortness of breath, asthma , neg COPD, neg recent URI   breath sounds clear to auscultation       Cardiovascular (-) hypertension(-) angina (-) CAD, (-) Past MI, (-) Cardiac Stents and (-) CABG  Rhythm:Regular Rate:Normal     Neuro/Psych Seizures -, Well Controlled,  PSYCHIATRIC DISORDERS         GI/Hepatic ,neg GERD  ,,(+) neg Cirrhosis        Endo/Other  neg diabetes    Renal/GU Renal disease     Musculoskeletal   Abdominal   Peds  Hematology   Anesthesia Other Findings   Reproductive/Obstetrics                             Anesthesia Physical Anesthesia Plan  ASA: 2  Anesthesia Plan: MAC   Post-op Pain Management:    Induction:   PONV Risk Score and Plan: 1 and Ondansetron  Airway Management Planned:   Additional Equipment:   Intra-op Plan:   Post-operative Plan:   Informed Consent: I have reviewed the patients History and Physical, chart, labs and discussed the procedure including the risks, benefits and alternatives for the proposed anesthesia with the patient or authorized representative who has indicated his/her understanding and acceptance.     Dental advisory given  Plan Discussed with: CRNA  Anesthesia Plan Comments:        Anesthesia Quick Evaluation

## 2023-10-11 ENCOUNTER — Encounter (HOSPITAL_BASED_OUTPATIENT_CLINIC_OR_DEPARTMENT_OTHER): Payer: Self-pay | Admitting: Orthopedic Surgery

## 2023-10-11 NOTE — Anesthesia Postprocedure Evaluation (Signed)
Anesthesia Post Note  Patient: Jerron Bisceglia  Procedure(s) Performed: CARPAL TUNNEL RELEASE (Left: Hand)     Patient location during evaluation: PACU Anesthesia Type: MAC Level of consciousness: awake and alert Pain management: pain level controlled Vital Signs Assessment: post-procedure vital signs reviewed and stable Respiratory status: spontaneous breathing, nonlabored ventilation, respiratory function stable and patient connected to nasal cannula oxygen Cardiovascular status: stable and blood pressure returned to baseline Postop Assessment: no apparent nausea or vomiting Anesthetic complications: no   No notable events documented.  Last Vitals:  Vitals:   10/10/23 1030 10/10/23 1045  BP: 114/73 113/87  Pulse: (!) 56 (!) 54  Resp: 17 20  Temp:  36.4 C  SpO2: 97% 98%    Last Pain:  Vitals:   10/10/23 1045  TempSrc: Temporal  PainSc: 0-No pain   Pain Goal: Patients Stated Pain Goal: 3 (10/10/23 0736)                 Mariann Barter
